# Patient Record
Sex: Female | Born: 1985 | Race: White | Hispanic: No | Marital: Single | Smoking: Current every day smoker
Health system: Southern US, Community
[De-identification: ages and names within clinical notes are randomized; demographics above are authoritative.]

## PROBLEM LIST (undated history)

## (undated) DIAGNOSIS — R569 Unspecified convulsions: Secondary | ICD-10-CM

---

## 2014-11-08 ENCOUNTER — Encounter (HOSPITAL_COMMUNITY): Payer: Self-pay | Admitting: Cardiology

## 2014-11-08 ENCOUNTER — Emergency Department (HOSPITAL_COMMUNITY): Payer: Self-pay

## 2014-11-08 ENCOUNTER — Emergency Department (HOSPITAL_COMMUNITY)
Admission: EM | Admit: 2014-11-08 | Discharge: 2014-11-08 | Disposition: A | Discharge status: Home / Self Care | Payer: Medicaid Other | Attending: Emergency Medicine | Admitting: Emergency Medicine

## 2014-11-08 DIAGNOSIS — Y93E1 Activity, personal bathing and showering: Secondary | ICD-10-CM | POA: Insufficient documentation

## 2014-11-08 DIAGNOSIS — Z720 Tobacco use: Secondary | ICD-10-CM | POA: Insufficient documentation

## 2014-11-08 DIAGNOSIS — W1839XA Other fall on same level, initial encounter: Secondary | ICD-10-CM | POA: Insufficient documentation

## 2014-11-08 DIAGNOSIS — R569 Unspecified convulsions: Secondary | ICD-10-CM | POA: Insufficient documentation

## 2014-11-08 DIAGNOSIS — S99922A Unspecified injury of left foot, initial encounter: Secondary | ICD-10-CM

## 2014-11-08 DIAGNOSIS — S9032XA Contusion of left foot, initial encounter: Secondary | ICD-10-CM | POA: Insufficient documentation

## 2014-11-08 DIAGNOSIS — Y92091 Bathroom in other non-institutional residence as the place of occurrence of the external cause: Secondary | ICD-10-CM | POA: Insufficient documentation

## 2014-11-08 DIAGNOSIS — W19XXXA Unspecified fall, initial encounter: Secondary | ICD-10-CM

## 2014-11-08 DIAGNOSIS — Y998 Other external cause status: Secondary | ICD-10-CM | POA: Insufficient documentation

## 2014-11-08 HISTORY — DX: Unspecified convulsions: R56.9

## 2014-11-08 MED ORDER — TRAMADOL HCL 50 MG PO TABS: 50.0000 mg | ORAL_TABLET | Freq: Four times a day (QID) | ORAL | Status: AC | PRN

## 2014-11-08 NOTE — ED Provider Notes (Signed)
CSN: [id number]     Arrival date & time 11/08/14  1140 History   First MD Initiated Contact with Patient 11/08/14 1151     Chief Complaint  Patient presents with  . Foot Pain   The history is provided by the patient. No language interpreter was used.  This chart was scribed for non-physician practitioner Emilia BeckKaitlyn Aadith Raudenbush, PA-C,  working with Geoffery Lyonsouglas Delo, MD, by Andrew Auaven Small, ED Scribe. This patient was seen in room TR07C/TR07C and the patient's care was started at 12:48 PM.  Tamara White is a 28 y.o. female with hx of seizures who presents to the Emergency Department complaining of a fall that occurred 1 day ago. Pt states she fell yesterday in the shower causing her to have a seizure and injure her left foot. Pt states she doesn't remember anything after the fall and is unsure how she injured her foot.  [facility].  Pt denies any other injury.   Past Medical History  Diagnosis Date  . Seizure    History reviewed. No pertinent past surgical history. History reviewed. No pertinent family history. History  Substance Use Topics  . Smoking status: Current Every Day Smoker    Types: Cigarettes  . Smokeless tobacco: Not on file  . Alcohol Use: Yes   OB History    No data available     Review of Systems  Musculoskeletal: Positive for myalgias.  Skin: Negative for wound.  Neurological: Positive for seizures. Negative for weakness and numbness.    Allergies  Review of patient's allergies indicates no known allergies.  Home Medications   Prior to Admission medications   Not on File   BP 112/62 mmHg  Pulse 60  Temp(Src) 97.6 F (36.4 C) (Oral)  Resp 20  SpO2 100%  LMP 09/23/2014 Physical Exam  Constitutional: She is oriented to person, place, and time. She appears well-developed and well-nourished. No distress.  HENT:  Head: Normocephalic and atraumatic.  Eyes: Conjunctivae and EOM are normal.  Neck: Neck supple.  Cardiovascular: Normal rate.    Pulmonary/Chest: Effort normal.  Abdominal: Soft.  Musculoskeletal: Normal range of motion.  Patient is able to wiggle toes of the left foot. Bruising and tenderness to palpation on the dorsal aspect of the left foot at the base of 2nd and 3rd tarsal bones.   Neurological: She is alert and oriented to person, place, and time.  Skin: Skin is warm and dry.  Psychiatric: She has a normal mood and affect. Her behavior is normal.  Nursing note and vitals reviewed.   ED Course  Procedures (including critical care time) DIAGNOSTIC STUDIES: Oxygen Saturation is 100% on RA, normal by my interpretation.    COORDINATION OF CARE: 11:57 AM- Pt advised of plan for treatment and pt agrees.  Labs Review Labs Reviewed - No data to display  Imaging Review Dg Foot Complete Right  11/08/2014   CLINICAL DATA:  Pain second and third toes of right foot. Bruising and swelling. Seizure. Fall. Initial encounter.  EXAM: RIGHT FOOT COMPLETE - 3+ VIEW  COMPARISON:  None.  FINDINGS: No fracture or dislocation, with particular attention to the second and third toes. The alignment and joint spaces are maintained. No erosion or periosteal reaction. Question soft tissue edema proximal second toe.  IMPRESSION: No fracture or dislocation of the right foot, with particular attention to the second and third toes.   Electronically Signed   By: Rubye OaksMelanie  Ehinger M.D.   On: 11/08/2014 12:32  EKG Interpretation None      MDM   Final diagnoses:  Fall  Foot injury, left, initial encounter    1:19 PM Xray unremarkable for acute changes. Patient will have tramadol for pain. No neurovascular compromise. No other injury.   I personally performed the services described in this documentation, which was scribed in my presence. The recorded information has been reviewed and is accurate.    Emilia BeckKaitlyn Tetsuo Coppola, PA-C 11/08/14 1320  Geoffery Lyonsouglas Delo, MD 11/09/14 [phone]

## 2014-11-08 NOTE — ED Notes (Signed)
Pt reports that she fell yesterday while in the shower and had a seizure. Reports a hx of seizures and injured her right foot.

## 2014-11-08 NOTE — Discharge Instructions (Signed)
Take tramadol as needed for pain. Rest, ice, and elevate your foot for pain relief.

## 2016-08-15 IMAGING — CR DG FOOT COMPLETE 3+V*R*
3 series · 3 of 3 positions shown · non-contrast
Comparison: None.

CLINICAL DATA: Pain second and third toes of right foot. Bruising
and swelling. Seizure. Fall. Initial encounter.

EXAM:
RIGHT FOOT COMPLETE - 3+ VIEW

[foot ap]
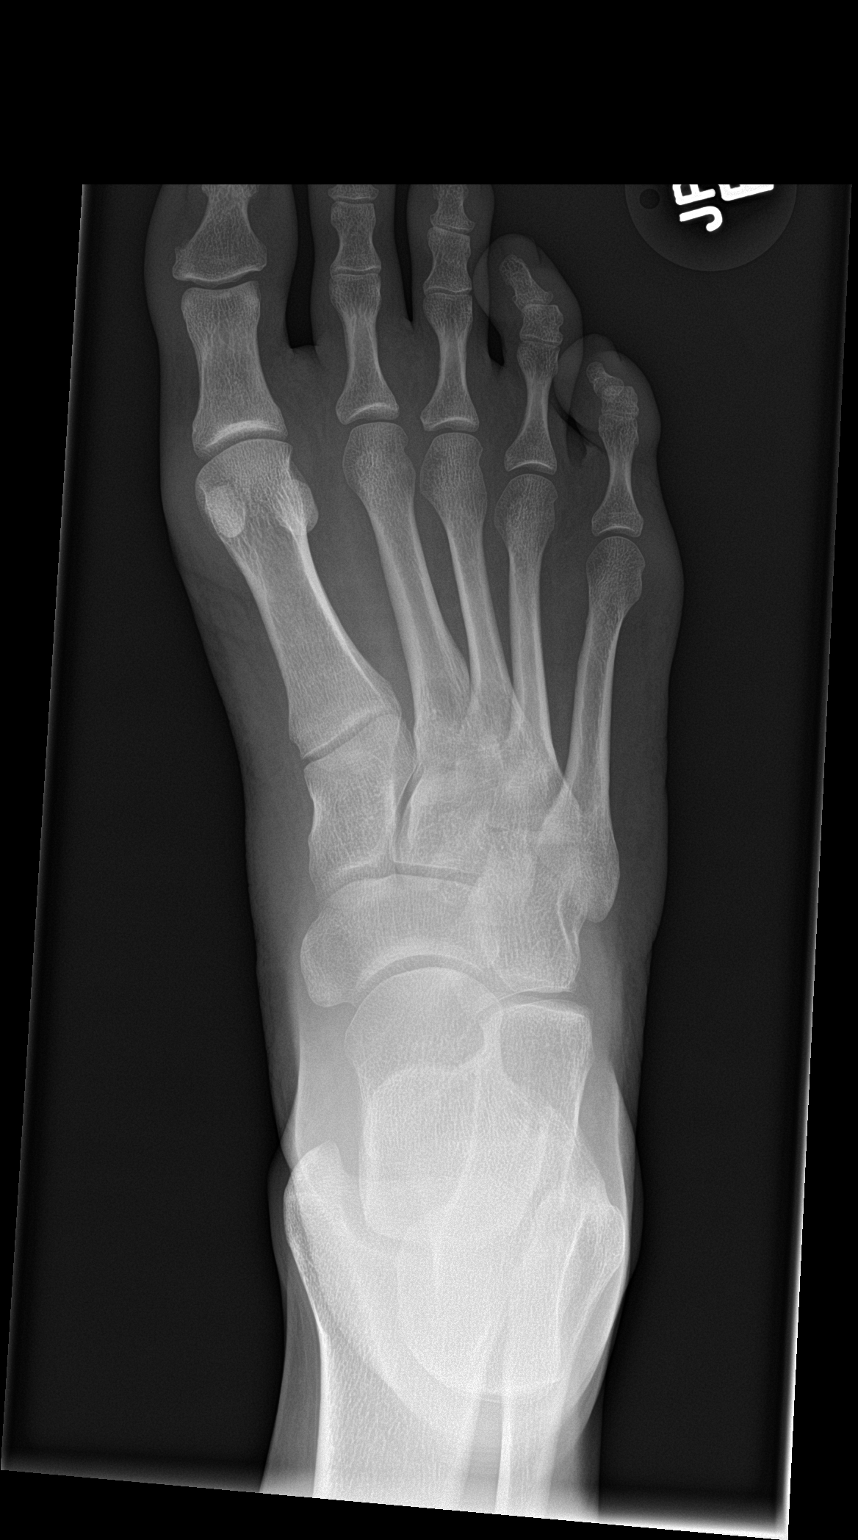

[foot obl]
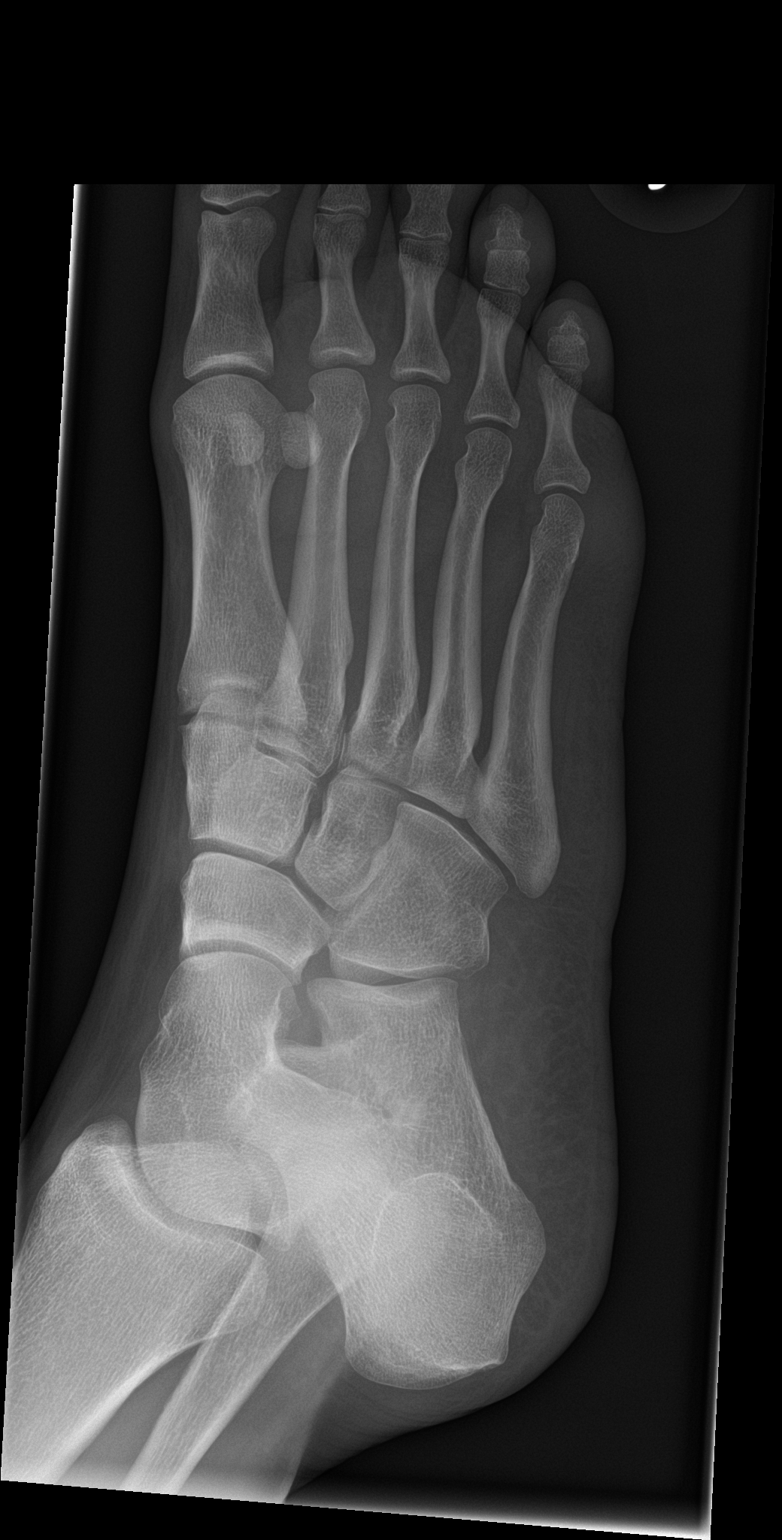

[foot lat]
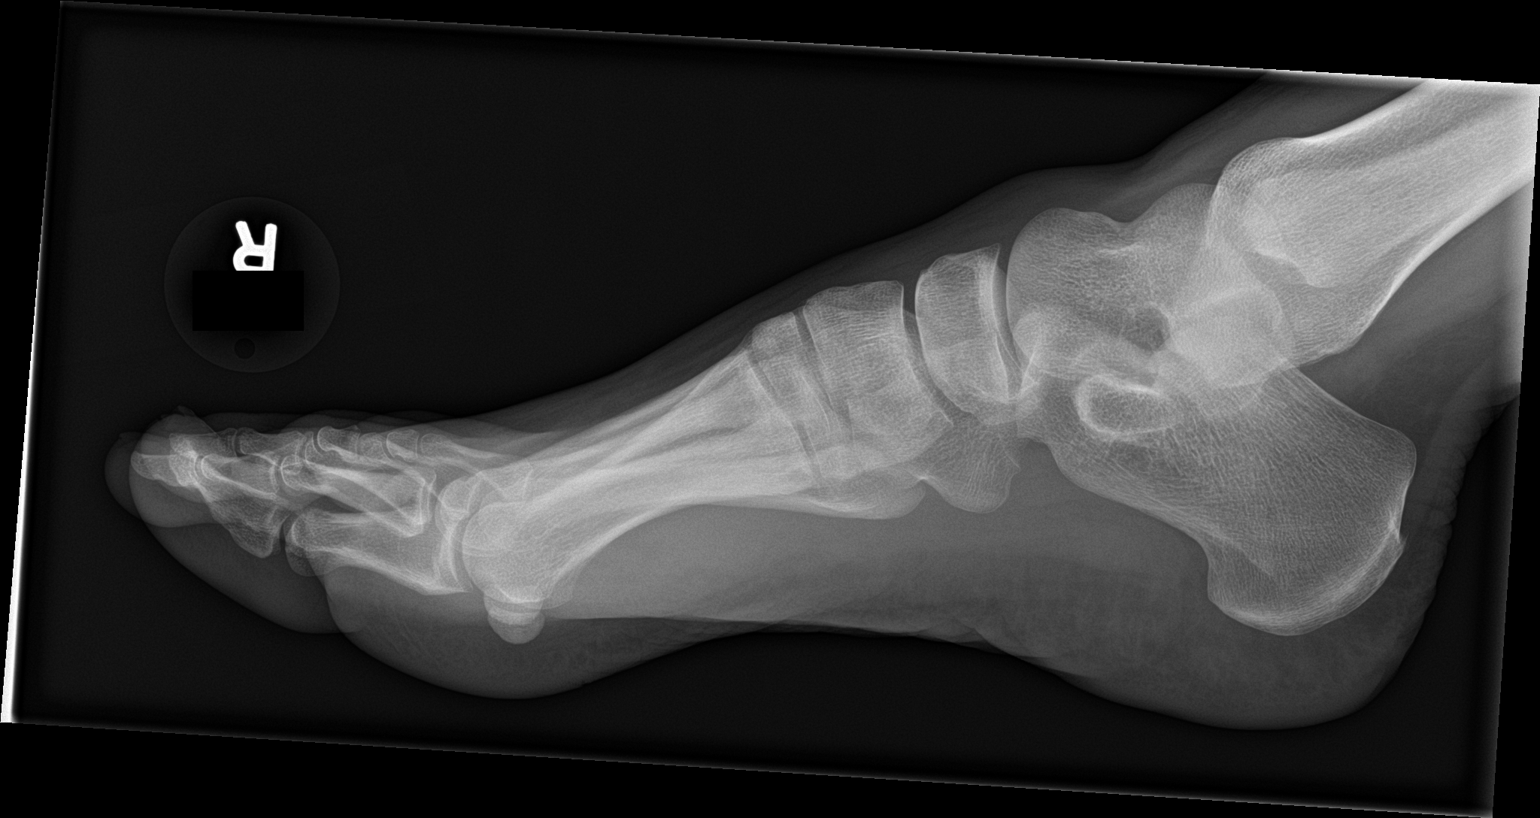

[3 of 3 positions shown; findings below may reference images not displayed]

FINDINGS: No fracture or dislocation, with particular attention to the second
and third toes. The alignment and joint spaces are maintained. No
erosion or periosteal reaction. Question soft tissue edema proximal
second toe.
IMPRESSION: No fracture or dislocation of the right foot, with particular
attention to the second and third toes.
